# Patient Record
Sex: Female | Born: 1990 | Race: Black or African American | Hispanic: No | Marital: Single | Smoking: Never smoker
Health system: Southern US, Community
[De-identification: ages and names within clinical notes are randomized; demographics above are authoritative.]

---

## 2014-01-12 ENCOUNTER — Emergency Department (HOSPITAL_COMMUNITY)
Admission: EM | Admit: 2014-01-12 | Discharge: 2014-01-12 | Disposition: A | Discharge status: Home / Self Care | Payer: PRIVATE HEALTH INSURANCE | Attending: Emergency Medicine | Admitting: Emergency Medicine

## 2014-01-12 ENCOUNTER — Encounter (HOSPITAL_COMMUNITY): Payer: Self-pay | Admitting: Emergency Medicine

## 2014-01-12 ENCOUNTER — Emergency Department (HOSPITAL_COMMUNITY): Payer: PRIVATE HEALTH INSURANCE

## 2014-01-12 DIAGNOSIS — S0993XA Unspecified injury of face, initial encounter: Secondary | ICD-10-CM | POA: Insufficient documentation

## 2014-01-12 DIAGNOSIS — R6884 Jaw pain: Secondary | ICD-10-CM

## 2014-01-12 DIAGNOSIS — S199XXA Unspecified injury of neck, initial encounter: Principal | ICD-10-CM

## 2014-01-12 MED ORDER — IBUPROFEN 800 MG PO TABS: 800.0000 mg | ORAL_TABLET | Freq: Three times a day (TID) | ORAL | Status: AC | PRN

## 2014-01-12 MED ORDER — OXYCODONE-ACETAMINOPHEN 5-325 MG PO TABS
1.0000 | ORAL_TABLET | Freq: Once | ORAL | Status: AC
  Administered 2014-01-12: 1 via ORAL
  Filled 2014-01-12: qty 1

## 2014-01-12 MED ORDER — HYDROCODONE-ACETAMINOPHEN 5-325 MG PO TABS: 1.0000 | ORAL_TABLET | ORAL | Status: AC | PRN
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
## 2014-01-12 NOTE — Discharge Instructions (Signed)
Read the information below.  Use the prescribed medication as directed.  Please discuss all new medications with your pharmacist.  Do not take additional tylenol while taking the prescribed pain medication to avoid overdose.  You may return to the Emergency Department at any time for worsening condition or any new symptoms that concern you.    You have had a head injury which does not appear to require admission at this time. A concussion is a state of changed mental ability from trauma. SEEK IMMEDIATE MEDICAL ATTENTION IF: There is confusion or drowsiness (although children frequently become drowsy after injury).  You cannot awaken the injured person.  There is nausea (feeling sick to your stomach) or continued, forceful vomiting.  You notice dizziness or unsteadiness which is getting worse, or inability to walk.  You have convulsions or unconsciousness.  You experience severe, persistent headaches not relieved by Tylenol?. (Do not take aspirin as this impairs clotting abilities). Take other pain medications only as directed.  You cannot use arms or legs normally.  There are changes in pupil sizes. (This is the black center in the colored part of the eye)  There is clear or bloody discharge from the nose or ears.  Change in speech, vision, swallowing, or understanding.  Localized weakness, numbness, tingling, or change in bowel or bladder control.   Assault, General Assault includes any behavior, whether intentional or reckless, which results in bodily injury to another person and/or damage to property. Included in this would be any behavior, intentional or reckless, that by its nature would be understood (interpreted) by a reasonable person as intent to harm another person or to damage his/her property. Threats may be oral or written. They may be communicated through regular mail, computer, fax, or phone. These threats may be direct or implied. FORMS OF ASSAULT INCLUDE:  Physically assaulting  a person. This includes physical threats to inflict physical harm as well as:  Slapping.  Hitting.  Poking.  Kicking.  Punching.  Pushing.  Arson.  Sabotage.  Equipment vandalism.  Damaging or destroying property.  Throwing or hitting objects.  Displaying a weapon or an object that appears to be a weapon in a threatening manner.  Carrying a firearm of any kind.  Using a weapon to harm someone.  Using greater physical size/strength to intimidate another.  Making intimidating or threatening gestures.  Bullying.  Hazing.  Intimidating, threatening, hostile, or abusive language directed toward another person.  It communicates the intention to engage in violence against that person. And it leads a reasonable person to expect that violent behavior may occur.  Stalking another person. IF IT HAPPENS AGAIN:  Immediately call for emergency help (911 in [place]  If someone poses clear and immediate danger to you, seek legal authorities to have a protective or restraining order put in place.  Less threatening assaults can at least be reported to authorities. STEPS TO TAKE IF A SEXUAL ASSAULT HAS HAPPENED  Go to an area of safety. This may include a shelter or staying with a friend. Stay away from the area where you have been attacked. A large percentage of sexual assaults are caused by a friend, relative or associate.  If medications were given by your caregiver, take them as directed for the full length of time prescribed.  Only take over-the-counter or prescription medicines for pain, discomfort, or fever as directed by your caregiver.  If you have come in contact with a sexual disease, find out if you are to be tested  again. If your caregiver is concerned about the HIV/AIDS virus, he/she may require you to have continued testing for several months.  For the protection of your privacy, test results can not be given over the phone. Make sure you receive the results of  your test. If your test results are not back during your visit, make an appointment with your caregiver to find out the results. Do not assume everything is normal if you have not heard from your caregiver or the medical facility. It is important for you to follow up on all of your test results.  File appropriate papers with authorities. This is important in all assaults, even if it has occurred in a family or by a friend. SEEK MEDICAL CARE IF:  You have new problems because of your injuries.  You have problems that may be because of the medicine you are taking, such as:  Rash.  Itching.  Swelling.  Trouble breathing.  You develop belly (abdominal) pain, feel sick to your stomach (nausea) or are vomiting.  You begin to run a temperature.  You need supportive care or referral to a rape crisis center. These are centers with trained personnel who can help you get through this ordeal. SEEK IMMEDIATE MEDICAL CARE IF:  You are afraid of being threatened, beaten, or abused. In [place] call 911.  You receive new injuries related to abuse.  You develop severe pain in any area injured in the assault or have any change in your condition that concerns you.  You faint or lose consciousness.  You develop chest pain or shortness of breath. Document Released: 10/04/2005 Document Revised: 12/27/2011 Document Reviewed: 05/22/2008 [facility] Patient Information 2014 [place], [place].
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
## 2014-01-13 NOTE — ED Provider Notes (Signed)
Medical screening examination/treatment/procedure(s) were performed by non-physician practitioner and as supervising physician I was immediately available for consultation/collaboration.   EKG Interpretation None        Terrace Chiem M Caoilainn Sacks, MD 01/13/14 0459
